# Patient Record
Sex: Male | Born: 1990 | Race: White | Hispanic: No | Marital: Single | Smoking: Never smoker
Health system: Southern US, Community
[De-identification: ages and names within clinical notes are randomized; demographics above are authoritative.]

---

## 2019-06-16 ENCOUNTER — Other Ambulatory Visit: Payer: Self-pay

## 2019-06-16 ENCOUNTER — Encounter (HOSPITAL_COMMUNITY): Payer: Self-pay | Admitting: Emergency Medicine

## 2019-06-16 ENCOUNTER — Emergency Department (HOSPITAL_COMMUNITY)
Admission: EM | Admit: 2019-06-16 | Discharge: 2019-06-16 | Disposition: A | Discharge status: Home / Self Care | Payer: BC Managed Care – PPO | Attending: Emergency Medicine | Admitting: Emergency Medicine

## 2019-06-16 DIAGNOSIS — Y999 Unspecified external cause status: Secondary | ICD-10-CM | POA: Insufficient documentation

## 2019-06-16 DIAGNOSIS — Y939 Activity, unspecified: Secondary | ICD-10-CM | POA: Insufficient documentation

## 2019-06-16 DIAGNOSIS — S0502XA Injury of conjunctiva and corneal abrasion without foreign body, left eye, initial encounter: Secondary | ICD-10-CM

## 2019-06-16 DIAGNOSIS — S0522XA Ocular laceration and rupture with prolapse or loss of intraocular tissue, left eye, initial encounter: Secondary | ICD-10-CM | POA: Diagnosis not present

## 2019-06-16 DIAGNOSIS — Y929 Unspecified place or not applicable: Secondary | ICD-10-CM | POA: Diagnosis not present

## 2019-06-16 DIAGNOSIS — X58XXXA Exposure to other specified factors, initial encounter: Secondary | ICD-10-CM | POA: Insufficient documentation

## 2019-06-16 DIAGNOSIS — S0592XA Unspecified injury of left eye and orbit, initial encounter: Secondary | ICD-10-CM | POA: Diagnosis present

## 2019-06-16 MED ORDER — FLUORESCEIN SODIUM 1 MG OP STRP
1.0000 | ORAL_STRIP | Freq: Once | OPHTHALMIC | Status: AC
  Administered 2019-06-16: 18:00:00 1 via OPHTHALMIC
  Filled 2019-06-16: qty 1

## 2019-06-16 MED ORDER — OXYCODONE-ACETAMINOPHEN 5-325 MG PO TABS
1.0000 | ORAL_TABLET | Freq: Once | ORAL | Status: AC
  Administered 2019-06-16: 18:00:00 1 via ORAL
  Filled 2019-06-16: qty 1

## 2019-06-16 MED ORDER — TETRACAINE HCL 0.5 % OP SOLN
2.0000 [drp] | Freq: Once | OPHTHALMIC | Status: AC
  Administered 2019-06-16: 2 [drp] via OPHTHALMIC
  Filled 2019-06-16: qty 4

## 2019-06-16 MED ORDER — OFLOXACIN 0.3 % OP SOLN
2.0000 [drp] | Freq: Four times a day (QID) | OPHTHALMIC | Status: DC
  Administered 2019-06-16: 2 [drp] via OPHTHALMIC
  Filled 2019-06-16: qty 5

## 2019-06-16 MED ORDER — ERYTHROMYCIN 5 MG/GM OP OINT
TOPICAL_OINTMENT | Freq: Once | OPHTHALMIC | Status: AC
  Filled 2019-06-16: qty 3.5

## 2019-06-16 MED ORDER — IBUPROFEN 200 MG PO TABS
600.0000 mg | ORAL_TABLET | Freq: Once | ORAL | Status: AC
  Administered 2019-06-16: 600 mg via ORAL
  Filled 2019-06-16: qty 3
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
## 2019-06-16 NOTE — ED Triage Notes (Signed)
Pt repots think must have scratched his left eye last night. Feels like something in it but flushed several times with no relief. Denies blurred vision.

## 2019-06-16 NOTE — Discharge Instructions (Addendum)
To use your eye medications please put 2 drops of the ofloxacin in your left eye 4 times a day.  AFTER the ofloxacin drops please put 1/2 inch ribbon in the left eye 4 times a day.   Go to Dr. Johnny Bridge office tomorrow to be seen at 830 am.  Please arrive at least 15 minutes early.   Today you received medications that may make you sleepy or impair your ability to make decisions.  For the next 24 hours please do not drive, operate heavy machinery, care for a small child with out another adult present, or perform any activities that may cause harm to you or someone else if you were to fall asleep or be impaired.   You are being prescribed a medication which may make you sleepy. Please follow up of listed precautions for at least 24 hours after taking one dose.  Please take Ibuprofen (Advil, motrin) and Tylenol (acetaminophen) to relieve your pain.  You may take up to 600 MG (3 pills) of normal strength ibuprofen every 8 hours as needed.  In between doses of ibuprofen you make take tylenol, up to 1,000 mg (two extra strength pills).  Do not take more than 3,000 mg tylenol in a 24 hour period.  Please check all medication labels as many medications such as pain and cold medications may contain tylenol.  Do not drink alcohol while taking these medications.  Do not take other NSAID'S while taking ibuprofen (such as aleve or naproxen).  Please take ibuprofen with food to decrease stomach upset.

## 2019-06-17 ENCOUNTER — Encounter (INDEPENDENT_AMBULATORY_CARE_PROVIDER_SITE_OTHER): Payer: Self-pay | Admitting: Ophthalmology

## 2019-06-17 ENCOUNTER — Ambulatory Visit (INDEPENDENT_AMBULATORY_CARE_PROVIDER_SITE_OTHER): Payer: BC Managed Care – PPO | Admitting: Ophthalmology

## 2019-06-17 DIAGNOSIS — H1830 Unspecified corneal membrane change: Secondary | ICD-10-CM | POA: Diagnosis not present

## 2019-06-17 DIAGNOSIS — H3581 Retinal edema: Secondary | ICD-10-CM | POA: Diagnosis not present
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
## 2019-06-21 ENCOUNTER — Encounter (INDEPENDENT_AMBULATORY_CARE_PROVIDER_SITE_OTHER): Payer: Self-pay | Admitting: Ophthalmology

## 2019-06-21 ENCOUNTER — Ambulatory Visit (INDEPENDENT_AMBULATORY_CARE_PROVIDER_SITE_OTHER): Payer: BC Managed Care – PPO | Admitting: Ophthalmology

## 2019-06-21 DIAGNOSIS — H3581 Retinal edema: Secondary | ICD-10-CM

## 2019-06-21 DIAGNOSIS — H1830 Unspecified corneal membrane change: Secondary | ICD-10-CM

## 2019-08-04 ENCOUNTER — Emergency Department (HOSPITAL_COMMUNITY): Payer: BC Managed Care – PPO

## 2019-08-04 ENCOUNTER — Emergency Department (HOSPITAL_COMMUNITY)
Admission: EM | Admit: 2019-08-04 | Discharge: 2019-08-04 | Disposition: A | Discharge status: Home / Self Care | Payer: BC Managed Care – PPO | Attending: Emergency Medicine | Admitting: Emergency Medicine

## 2019-08-04 ENCOUNTER — Other Ambulatory Visit: Payer: Self-pay

## 2019-08-04 ENCOUNTER — Encounter (HOSPITAL_COMMUNITY): Payer: Self-pay | Admitting: Emergency Medicine

## 2019-08-04 DIAGNOSIS — F1010 Alcohol abuse, uncomplicated: Secondary | ICD-10-CM

## 2019-08-04 DIAGNOSIS — Z20822 Contact with and (suspected) exposure to covid-19: Secondary | ICD-10-CM | POA: Insufficient documentation

## 2019-08-04 DIAGNOSIS — K209 Esophagitis, unspecified without bleeding: Secondary | ICD-10-CM | POA: Insufficient documentation

## 2019-08-04 DIAGNOSIS — R509 Fever, unspecified: Secondary | ICD-10-CM | POA: Insufficient documentation

## 2019-08-04 LAB — TROPONIN I (HIGH SENSITIVITY)
Troponin I (High Sensitivity): 3 ng/L (ref <18)
Troponin I (High Sensitivity): 4 ng/L (ref <18)

## 2019-08-04 LAB — HEPATIC FUNCTION PANEL
ALT: 35 U/L (ref 0–44)
AST: 28 U/L (ref 15–41)
Albumin: 4 g/dL (ref 3.5–5.0)
Alkaline Phosphatase: 47 U/L (ref 38–126)
Bilirubin, Direct: 0.1 mg/dL (ref 0.0–0.2)
Indirect Bilirubin: 0.3 mg/dL (ref 0.3–0.9)
Total Bilirubin: 0.4 mg/dL (ref 0.3–1.2)
Total Protein: 7.6 g/dL (ref 6.5–8.1)

## 2019-08-04 LAB — BASIC METABOLIC PANEL
Anion gap: 10 (ref 5–15)
BUN: 8 mg/dL (ref 6–20)
CO2: 25 mmol/L (ref 22–32)
Calcium: 8.7 mg/dL — ABNORMAL LOW (ref 8.9–10.3)
Chloride: 100 mmol/L (ref 98–111)
Creatinine, Ser: 0.76 mg/dL (ref 0.61–1.24)
GFR calc Af Amer: >60 mL/min (ref >60)
GFR calc non Af Amer: >60 mL/min (ref >60)
Glucose, Bld: 92 mg/dL (ref 70–99)
Potassium: 3.9 mmol/L (ref 3.5–5.1)
Sodium: 135 mmol/L (ref 135–145)

## 2019-08-04 LAB — CBC
HCT: 42.8 % (ref 39.0–52.0)
Hemoglobin: 14.5 g/dL (ref 13.0–17.0)
MCH: 31.3 pg (ref 26.0–34.0)
MCHC: 33.9 g/dL (ref 30.0–36.0)
MCV: 92.2 fL (ref 80.0–100.0)
Platelets: 154 10*3/uL (ref 150–400)
RBC: 4.64 MIL/uL (ref 4.22–5.81)
RDW: 12.1 % (ref 11.5–15.5)
WBC: 7.8 10*3/uL (ref 4.0–10.5)
nRBC: 0 % (ref 0.0–0.2)

## 2019-08-04 LAB — URINALYSIS, ROUTINE W REFLEX MICROSCOPIC
Bilirubin Urine: NEGATIVE
Glucose, UA: NEGATIVE mg/dL
Hgb urine dipstick: NEGATIVE
Ketones, ur: NEGATIVE mg/dL
Leukocytes,Ua: NEGATIVE
Nitrite: NEGATIVE
Protein, ur: NEGATIVE mg/dL
Specific Gravity, Urine: 1.019 (ref 1.005–1.030)
pH: 6 (ref 5.0–8.0)

## 2019-08-04 LAB — LIPASE, BLOOD: Lipase: 146 U/L — ABNORMAL HIGH (ref 11–51)

## 2019-08-04 LAB — SARS CORONAVIRUS 2 BY RT PCR (HOSPITAL ORDER, PERFORMED IN ~~LOC~~ HOSPITAL LAB): SARS Coronavirus 2: NEGATIVE

## 2019-08-04 LAB — D-DIMER, QUANTITATIVE: D-Dimer, Quant: 0.34 ug/mL-FEU (ref 0.00–0.50)

## 2019-08-04 MED ORDER — ALUM & MAG HYDROXIDE-SIMETH 200-200-20 MG/5ML PO SUSP
30.0000 mL | Freq: Once | ORAL | Status: AC
  Administered 2019-08-04: 30 mL via ORAL
  Filled 2019-08-04: qty 30

## 2019-08-04 MED ORDER — PANTOPRAZOLE SODIUM 40 MG PO TBEC
40.0000 mg | DELAYED_RELEASE_TABLET | Freq: Every day | ORAL | 0 refills | Status: DC
Start: 2019-08-04 — End: 2019-08-10

## 2019-08-04 MED ORDER — SODIUM CHLORIDE 0.9 % IV BOLUS
1000.0000 mL | Freq: Once | INTRAVENOUS | Status: AC
  Administered 2019-08-04: 1000 mL via INTRAVENOUS

## 2019-08-04 MED ORDER — FAMOTIDINE IN NACL 20-0.9 MG/50ML-% IV SOLN
20.0000 mg | Freq: Once | INTRAVENOUS | Status: AC
  Administered 2019-08-04: 20 mg via INTRAVENOUS
  Filled 2019-08-04: qty 50

## 2019-08-04 MED ORDER — IOHEXOL 300 MG/ML  SOLN
100.0000 mL | Freq: Once | INTRAMUSCULAR | Status: AC | PRN
  Administered 2019-08-04: 100 mL via INTRAVENOUS

## 2019-08-04 MED ORDER — SUCRALFATE 1 GM/10ML PO SUSP
1.0000 g | Freq: Three times a day (TID) | ORAL | Status: DC
  Administered 2019-08-04: 1 g via ORAL
  Filled 2019-08-04: qty 10

## 2019-08-04 MED ORDER — CHLORDIAZEPOXIDE HCL 25 MG PO CAPS
ORAL_CAPSULE | ORAL | 0 refills | Status: DC
Start: 2019-08-04 — End: 2019-09-07

## 2019-08-04 MED ORDER — PANTOPRAZOLE SODIUM 40 MG IV SOLR
40.0000 mg | Freq: Once | INTRAVENOUS | Status: AC
  Administered 2019-08-04: 40 mg via INTRAVENOUS
  Filled 2019-08-04: qty 40

## 2019-08-04 MED ORDER — ONDANSETRON HCL 4 MG/2ML IJ SOLN
4.0000 mg | Freq: Once | INTRAMUSCULAR | Status: AC
  Administered 2019-08-04: 4 mg via INTRAVENOUS
  Filled 2019-08-04: qty 2

## 2019-08-04 MED ORDER — ACETAMINOPHEN 500 MG PO TABS
1000.0000 mg | ORAL_TABLET | Freq: Once | ORAL | Status: AC
  Administered 2019-08-04: 1000 mg via ORAL
  Filled 2019-08-04: qty 2

## 2019-08-04 NOTE — ED Triage Notes (Signed)
Patient reports indigestion x3 days with burning in chest today.

## 2019-08-04 NOTE — ED Provider Notes (Signed)
[facility]-EMERGENCY DEPT Provider Note   CSN: [id number] Arrival date & time: 08/04/19  1421     History Chief Complaint  Patient presents with  . Chest Pain    Ryan Patton is a 29 y.o. male.  HPI   Pt is a 29 y/o male who presents to the ED today for eval of chest pain/epigastric abd pain that started about 3 days ago. States pain is intermittent and worsened this morning. Pain radiates to the left side of the chest. Reports pleuritic pain.   States that he recently cut down on his drinking and was drinking 1/5 of liquor daily but last week he cut down and now has only been drinking 3-4 times/week. Last drink was at 1pm. States he has had a few beers today.   He also reports chills, sweats,    Reports nausea and some vomiting (5 days ago which resolved). Had one episode of blood tinged sputum 5 days ago after multiple episodes of vomiting.  Denies cough, rhinorrhea, congestion,  He had some shortness of breath this morning which has improved.  Had COVID in Dec 2020.   marijuana 3-4 times/week, 1/5 liquor, last drink 1pm a few beers  History reviewed. No pertinent past medical history.  There are no problems to display for this patient.   History reviewed. No pertinent surgical history.     No family history on file.  Social History   Tobacco Use  . Smoking status: Not on file  Substance Use Topics  . Alcohol use: Not on file  . Drug use: Not on file    Home Medications Prior to Admission medications   Medication Sig Start Date End Date Taking? Authorizing Provider  chlordiazePOXIDE (LIBRIUM) 25 MG capsule 50mg  PO TID x 1D, then 25-50mg  PO BID X 1D, then 25-50mg  PO QD X 1D 08/04/19   Edric Fetterman S, PA-C  pantoprazole (PROTONIX) 40 MG tablet Take 1 tablet (40 mg total) by mouth daily for 14 days. 08/04/19 08/18/19  Libertie Hausler S, PA-C    Allergies    Patient has no known allergies.  Review of Systems   Review of Systems   Constitutional: Negative for chills and fever.  HENT: Negative for ear pain and sore throat.   Eyes: Negative for visual disturbance.  Respiratory: Negative for cough and shortness of breath.   Cardiovascular: Positive for chest pain. Negative for palpitations and leg swelling.  Gastrointestinal: Negative for abdominal pain, constipation, diarrhea, nausea and vomiting.  Genitourinary: Negative for dysuria and hematuria.  Musculoskeletal: Negative for back pain.  Skin: Negative for color change and rash.  Neurological: Positive for headaches.  All other systems reviewed and are negative.   Physical Exam Updated Vital Signs BP (!) 149/96   Pulse (!) 104   Temp (!) 100.5 F (38.1 C)   Resp 14   SpO2 98%   Physical Exam  ED Results / Procedures / Treatments   Labs (all labs ordered are listed, but only abnormal results are displayed) Labs Reviewed  BASIC METABOLIC PANEL - Abnormal; Notable for the following components:      Result Value   Calcium 8.7 (*)    All other components within normal limits  LIPASE, BLOOD - Abnormal; Notable for the following components:   Lipase 146 (*)    All other components within normal limits  SARS CORONAVIRUS 2 BY RT PCR (HOSPITAL ORDER, PERFORMED IN [facility])  CBC  D-DIMER, QUANTITATIVE (NOT AT [facility])  HEPATIC FUNCTION PANEL  URINALYSIS, ROUTINE W REFLEX MICROSCOPIC  TROPONIN I (HIGH SENSITIVITY)  TROPONIN I (HIGH SENSITIVITY)    EKG None  Radiology DG Chest 2 View  Result Date: 08/04/2019 CLINICAL DATA:  Chest pain, indigestion, remote COVID infection 5 months ago EXAM: CHEST - 2 VIEW COMPARISON:  None. FINDINGS: The heart size and mediastinal contours are within normal limits. Both lungs are clear. The visualized skeletal structures are unremarkable. IMPRESSION: No active cardiopulmonary disease. Electronically Signed   By: Judie Petit.  Shick M.D.   On: 08/04/2019 15:17   CT ABDOMEN PELVIS W CONTRAST  Result Date:  08/04/2019 CLINICAL DATA:  Indigestion.  Epigastric pain.  Fever.  Tachycardia. EXAM: CT ABDOMEN AND PELVIS WITH CONTRAST TECHNIQUE: Multidetector CT imaging of the abdomen and pelvis was performed using the standard protocol following bolus administration of intravenous contrast. CONTRAST:  OMNIPAQUE IOHEXOL 300 MG/ML  SOLN COMPARISON:  None. FINDINGS: Lower chest: Lung bases are clear. Hepatobiliary: No focal hepatic lesion. No biliary duct dilatation. Gallbladder is normal. Common bile duct is normal. Pancreas: Pancreas is normal. No ductal dilatation. No pancreatic inflammation. Spleen: Normal spleen Adrenals/urinary tract: Adrenal glands and kidneys are normal. The ureters and bladder normal. Stomach/Bowel: Mild circumferential thickening through the distal esophagus (image 7/series 2). Stomach, small-bowel and cecum are normal. The appendix is not identified but there is no pericecal inflammation to suggest appendicitis. The colon and rectosigmoid colon are normal. Vascular/Lymphatic: Abdominal aorta is normal caliber. No periportal or retroperitoneal adenopathy. No pelvic adenopathy. Reproductive: Prostate unremarkable Other: No free fluid. Musculoskeletal: No aggressive osseous lesion. IMPRESSION: 1. Mild thickening through the distal esophagus could represent esophagitis. 2. No acute findings in the abdomen pelvis otherwise. Electronically Signed   By: Genevive Bi M.D.   On: 08/04/2019 19:14    Procedures Procedures (including critical care time)  Medications Ordered in ED Medications  sucralfate (CARAFATE) 1 GM/10ML suspension 1 g (1 g Oral Given 08/04/19 2051)  acetaminophen (TYLENOL) tablet 1,000 mg (1,000 mg Oral Given 08/04/19 1812)  sodium chloride 0.9 % bolus 1,000 mL (0 mLs Intravenous Stopped 08/04/19 2006)  ondansetron (ZOFRAN) injection 4 mg (4 mg Intravenous Given 08/04/19 1812)  famotidine (PEPCID) IVPB 20 mg premix (0 mg Intravenous Stopped 08/04/19 2006)  alum & mag  hydroxide-simeth (MAALOX/MYLANTA) 200-200-20 MG/5ML suspension 30 mL (30 mLs Oral Given 08/04/19 1813)  iohexol (OMNIPAQUE) 300 MG/ML solution 100 mL (100 mLs Intravenous Contrast Given 08/04/19 1838)  sodium chloride 0.9 % bolus 1,000 mL (1,000 mLs Intravenous New Bag/Given (Non-Interop) 08/04/19 2026)  pantoprazole (PROTONIX) injection 40 mg (40 mg Intravenous Given 08/04/19 2051)    ED Course  I have reviewed the triage vital signs and the nursing notes.  Pertinent labs & imaging results that were available during my care of the patient were reviewed by me and considered in my medical decision making (see chart for details).    MDM Rules/Calculators/A&P                      29 year old male presenting for evaluation of chest pain/epigastric abdominal pain which feels consistent with heartburn.  Also with some nausea.  Patient has long history of EtOH use.  On arrival patient found to be febrile and tachycardic, his vital signs are otherwise reassuring and he is nontoxic and nonseptic appearing.  He has some upper abdominal tenderness on exam but he does not have any peritoneal signs.  Lungs clear to auscultation bilaterally.  Heart with tachycardia but otherwise reassuring.  CBC does not show any leukocytosis CMP with normal electrolytes, normal kidney and liver function Lipase is marginally elevated Troponins negative D-dimer negative Covid negative  EKG with sinus tach, borderline right axis deviation, otherwise reassuring   CXR  No active cardiopulmonary disease.  CT abd/pelvis  1. Mild thickening through the distal esophagus could represent esophagitis. 2. No acute findings in the abdomen pelvis otherwise.   Reassessed patient.  He states he is feeling somewhat better.  His heart rate is improved somewhat after IV fluids and his temperature is also improved.  We discussed findings of esophagitis on CT scan and also elevated lipase concerning for possible mild pancreatitis.  Discussed  cessation of alcohol use and he would like to stop.  We will give Librium taper and consult peer support.  We will also give him information for PCP and start him on a PPI for his esophagitis.    At shift change, patient still pending UA.  If negative, follow-up on above plan to DC with PPI and Librium taper. Care transitioned to [facility], PA-C at shift change.   Discussed case with Dr. Roslynn Amble who personally evaluated the patient and is in agreement with the plan.   Final Clinical Impression(s) / ED Diagnoses Final diagnoses:  Esophagitis  Alcohol abuse  Fever, unspecified fever cause    Rx / DC Orders ED Discharge Orders         Ordered    pantoprazole (PROTONIX) 40 MG tablet  Daily     08/04/19 2103    chlordiazePOXIDE (LIBRIUM) 25 MG capsule     08/04/19 2103    Consult to Peer Support    Provider:  (Not yet assigned)   08/04/19 2104           Rodney Booze, PA-C 08/04/19 2104    Lucrezia Starch, MD 08/05/19 1212

## 2019-08-04 NOTE — Discharge Instructions (Addendum)
Take Protonix as directed for your esophagitis.   Prescription given for Librium. Take medication as directed and do not operate machinery, drive a car, or work while taking this medication as it can make you drowsy.   Please follow up with your primary care provider within 5-7 days for re-evaluation of your symptoms. If you do not have a primary care provider, information for a healthcare clinic has been provided for you to make arrangements for follow up care. Please return to the emergency department for any new or worsening symptoms.

## 2019-08-09 ENCOUNTER — Emergency Department (HOSPITAL_COMMUNITY)
Admission: EM | Admit: 2019-08-09 | Discharge: 2019-08-09 | Discharge status: Against Medical Advice | Payer: BC Managed Care – PPO | Attending: Emergency Medicine | Admitting: Emergency Medicine

## 2019-08-09 ENCOUNTER — Encounter (HOSPITAL_COMMUNITY): Payer: Self-pay

## 2019-08-09 ENCOUNTER — Other Ambulatory Visit: Payer: Self-pay

## 2019-08-09 DIAGNOSIS — R1010 Upper abdominal pain, unspecified: Secondary | ICD-10-CM | POA: Insufficient documentation

## 2019-08-09 LAB — LIPASE, BLOOD: Lipase: 26 U/L (ref 11–51)

## 2019-08-09 LAB — COMPREHENSIVE METABOLIC PANEL
ALT: 26 U/L (ref 0–44)
AST: 18 U/L (ref 15–41)
Albumin: 3.9 g/dL (ref 3.5–5.0)
Alkaline Phosphatase: 45 U/L (ref 38–126)
Anion gap: 12 (ref 5–15)
BUN: 8 mg/dL (ref 6–20)
CO2: 27 mmol/L (ref 22–32)
Calcium: 9.1 mg/dL (ref 8.9–10.3)
Chloride: 101 mmol/L (ref 98–111)
Creatinine, Ser: 0.91 mg/dL (ref 0.61–1.24)
GFR calc Af Amer: >60 mL/min (ref >60)
GFR calc non Af Amer: >60 mL/min (ref >60)
Glucose, Bld: 98 mg/dL (ref 70–99)
Potassium: 4.3 mmol/L (ref 3.5–5.1)
Sodium: 140 mmol/L (ref 135–145)
Total Bilirubin: 0.9 mg/dL (ref 0.3–1.2)
Total Protein: 7.5 g/dL (ref 6.5–8.1)

## 2019-08-09 LAB — CBC
HCT: 42.1 % (ref 39.0–52.0)
Hemoglobin: 14.2 g/dL (ref 13.0–17.0)
MCH: 30.6 pg (ref 26.0–34.0)
MCHC: 33.7 g/dL (ref 30.0–36.0)
MCV: 90.7 fL (ref 80.0–100.0)
Platelets: 243 10*3/uL (ref 150–400)
RBC: 4.64 MIL/uL (ref 4.22–5.81)
RDW: 11.7 % (ref 11.5–15.5)
WBC: 5.4 10*3/uL (ref 4.0–10.5)
nRBC: 0 % (ref 0.0–0.2)

## 2019-08-09 MED ORDER — SODIUM CHLORIDE 0.9% FLUSH: 3.0000 mL | Freq: Once | INTRAVENOUS | Status: DC

## 2019-08-09 NOTE — ED Notes (Signed)
Pt called from lobby x3! No answer

## 2019-08-09 NOTE — ED Notes (Signed)
Called pt from lobby No answer 

## 2019-08-09 NOTE — ED Triage Notes (Signed)
Patient c/o mid upper abdominal pain since 08/04/19. Patient states he was diagnosed with esophagitis. Patient states the meds he received are not working and he has not eaten in 3 days.

## 2019-08-10 ENCOUNTER — Emergency Department (HOSPITAL_COMMUNITY)
Admission: EM | Admit: 2019-08-10 | Discharge: 2019-08-10 | Discharge status: Against Medical Advice | Payer: BC Managed Care – PPO | Attending: Emergency Medicine | Admitting: Emergency Medicine

## 2019-08-10 ENCOUNTER — Other Ambulatory Visit: Payer: Self-pay

## 2019-08-10 ENCOUNTER — Emergency Department (HOSPITAL_COMMUNITY): Payer: BC Managed Care – PPO

## 2019-08-10 ENCOUNTER — Encounter (HOSPITAL_COMMUNITY): Payer: Self-pay | Admitting: *Deleted

## 2019-08-10 DIAGNOSIS — R1084 Generalized abdominal pain: Secondary | ICD-10-CM

## 2019-08-10 DIAGNOSIS — Z5321 Procedure and treatment not carried out due to patient leaving prior to being seen by health care provider: Secondary | ICD-10-CM | POA: Insufficient documentation

## 2019-08-10 DIAGNOSIS — I10 Essential (primary) hypertension: Secondary | ICD-10-CM | POA: Insufficient documentation

## 2019-08-10 LAB — COMPREHENSIVE METABOLIC PANEL
ALT: 24 U/L (ref 0–44)
AST: 17 U/L (ref 15–41)
Albumin: 4.2 g/dL (ref 3.5–5.0)
Alkaline Phosphatase: 48 U/L (ref 38–126)
Anion gap: 12 (ref 5–15)
BUN: 8 mg/dL (ref 6–20)
CO2: 25 mmol/L (ref 22–32)
Calcium: 9.1 mg/dL (ref 8.9–10.3)
Chloride: 103 mmol/L (ref 98–111)
Creatinine, Ser: 0.91 mg/dL (ref 0.61–1.24)
GFR calc Af Amer: >60 mL/min (ref >60)
GFR calc non Af Amer: >60 mL/min (ref >60)
Glucose, Bld: 103 mg/dL — ABNORMAL HIGH (ref 70–99)
Potassium: 3.9 mmol/L (ref 3.5–5.1)
Sodium: 140 mmol/L (ref 135–145)
Total Bilirubin: 0.9 mg/dL (ref 0.3–1.2)
Total Protein: 7.6 g/dL (ref 6.5–8.1)

## 2019-08-10 LAB — CBC WITH DIFFERENTIAL/PLATELET
Abs Immature Granulocytes: 0.02 10*3/uL (ref 0.00–0.07)
Basophils Absolute: 0 10*3/uL (ref 0.0–0.1)
Basophils Relative: 1 %
Eosinophils Absolute: 0 10*3/uL (ref 0.0–0.5)
Eosinophils Relative: 1 %
HCT: 41.8 % (ref 39.0–52.0)
Hemoglobin: 14.3 g/dL (ref 13.0–17.0)
Immature Granulocytes: 0 %
Lymphocytes Relative: 27 %
Lymphs Abs: 1.5 10*3/uL (ref 0.7–4.0)
MCH: 30.6 pg (ref 26.0–34.0)
MCHC: 34.2 g/dL (ref 30.0–36.0)
MCV: 89.3 fL (ref 80.0–100.0)
Monocytes Absolute: 0.8 10*3/uL (ref 0.1–1.0)
Monocytes Relative: 15 %
Neutro Abs: 3.1 10*3/uL (ref 1.7–7.7)
Neutrophils Relative %: 56 %
Platelets: 286 10*3/uL (ref 150–400)
RBC: 4.68 MIL/uL (ref 4.22–5.81)
RDW: 11.6 % (ref 11.5–15.5)
WBC: 5.5 10*3/uL (ref 4.0–10.5)
nRBC: 0 % (ref 0.0–0.2)

## 2019-08-10 LAB — LIPASE, BLOOD: Lipase: 30 U/L (ref 11–51)

## 2019-08-10 LAB — POC OCCULT BLOOD, ED: Fecal Occult Bld: NEGATIVE

## 2019-08-10 MED ORDER — SUCRALFATE 1 GM/10ML PO SUSP: 1.0000 g | Freq: Three times a day (TID) | ORAL | 0 refills | Status: AC

## 2019-08-10 MED ORDER — SUCRALFATE 1 G PO TABS
1.0000 g | ORAL_TABLET | Freq: Once | ORAL | Status: AC
  Administered 2019-08-10: 1 g via ORAL
  Filled 2019-08-10: qty 1

## 2019-08-10 MED ORDER — PANTOPRAZOLE SODIUM 40 MG PO TBEC
40.0000 mg | DELAYED_RELEASE_TABLET | Freq: Two times a day (BID) | ORAL | 0 refills | Status: AC
Start: 2019-08-10 — End: 2019-09-09

## 2019-08-10 MED ORDER — FAMOTIDINE 20 MG PO TABS: 20.0000 mg | ORAL_TABLET | Freq: Two times a day (BID) | ORAL | 0 refills | Status: AC

## 2019-08-10 MED ORDER — ALUM & MAG HYDROXIDE-SIMETH 200-200-20 MG/5ML PO SUSP
30.0000 mL | Freq: Once | ORAL | Status: AC
  Administered 2019-08-10: 30 mL via ORAL
  Filled 2019-08-10: qty 30

## 2019-08-10 MED ORDER — LIDOCAINE VISCOUS HCL 2 % MT SOLN
15.0000 mL | Freq: Once | OROMUCOSAL | Status: AC
  Administered 2019-08-10: 15 mL via ORAL
  Filled 2019-08-10: qty 15

## 2019-08-10 MED ORDER — FAMOTIDINE IN NACL 20-0.9 MG/50ML-% IV SOLN
20.0000 mg | Freq: Once | INTRAVENOUS | Status: AC
  Administered 2019-08-10: 20 mg via INTRAVENOUS
  Filled 2019-08-10: qty 50

## 2019-08-10 MED ORDER — SODIUM CHLORIDE 0.9 % IV BOLUS
1000.0000 mL | Freq: Once | INTRAVENOUS | Status: AC
  Administered 2019-08-10: 1000 mL via INTRAVENOUS

## 2019-08-10 NOTE — ED Notes (Signed)
I called patient name for a room and no one responded 

## 2019-08-10 NOTE — ED Provider Notes (Signed)
[facility]-EMERGENCY DEPT Provider Note   CSN: [id number] Arrival date & time: 08/10/19  0731     History Chief Complaint  Patient presents with  . Abdominal Pain    Ryan Patton is a 29 y.o. male past history of alcoholism who presents for evaluation of persistent and continued abdominal pain and midsternal chest pain.  He was seen here in the ED on 08/04/2019 for evaluation of symptoms.  At that time, his work-up was concerning for esophagitis.  Additionally, he had a slight increase in his lipase.  He does report that he was a daily drinker but states that he has stopped over the last few weeks.  He states that he was discharged home with Pepcid which he has been taking.  He comes into the emergency department today because he states that he has continued to have persistent symptoms.  He reports epigastric abdominal pain that is constant in nature.  He states that it is worsened when he tries to eat something.  Additionally, he reports that he has a lot of heartburn and reports a burning sensation to the midsternal chest area.  He states that this is not as constant as the abdominal pain but is there for majority of the time and is worsened whenever he tries to eat anything.  He states that over the last 4 to 5 days, he has not been able to tolerate much p.o. because of the pain when swallowing and the pain to his abdomen.  He has not had any vomiting.  He does report that he had one episode of black stool yesterday.  He does state that he has been taking Pepto-Bismol.  He has not drink any alcohol since the visit on the ninth.  He does report that he vapes daily.  He does not know how many times he vapes but states "a lot."  He has not had any fever, difficulty breathing, dysuria, hematuria.  The history is provided by the patient.       History reviewed. No pertinent past medical history.  There are no problems to display for this patient.   History reviewed. No  pertinent surgical history.     Family History  Problem Relation Age of Onset  . Alcoholism Father     Social History   Tobacco Use  . Smoking status: Never Smoker  . Smokeless tobacco: Never Used  Vaping Use  . Vaping Use: Every day  . Substances: Nicotine, Flavoring  Substance Use Topics  . Alcohol use: Not Currently  . Drug use: Yes    Types: Marijuana    Home Medications Prior to Admission medications   Medication Sig Start Date End Date Taking? Authorizing Provider  acetaminophen (TYLENOL) 500 MG tablet Take 500 mg by mouth daily as needed for mild pain or headache.   Yes [provider]  tetrahydrozoline 0.05 % ophthalmic solution Place 1 drop into both eyes daily as needed (eye irritation).   Yes [provider]  chlordiazePOXIDE (LIBRIUM) 25 MG capsule 50mg  PO TID x 1D, then 25-50mg  PO BID X 1D, then 25-50mg  PO QD X 1D Patient not taking: Reported on 08/10/2019 08/04/19   Couture, Cortni S, PA-C  famotidine (PEPCID) 20 MG tablet Take 1 tablet (20 mg total) by mouth 2 (two) times daily. 08/10/19   08/12/19, PA-C  pantoprazole (PROTONIX) 40 MG tablet Take 1 tablet (40 mg total) by mouth 2 (two) times daily. 08/10/19 09/09/19  09/11/19, PA-C  sucralfate (CARAFATE) 1 GM/10ML suspension Take 10 mLs (1 g total) by mouth 4 (four) times daily -  with meals and at bedtime. 08/10/19   Maxwell Caul, PA-C    Allergies    Patient has no known allergies.  Review of Systems   Review of Systems  Constitutional: Negative for fever.  Respiratory: Negative for cough and shortness of breath.   Cardiovascular: Negative for chest pain.  Gastrointestinal: Positive for abdominal pain. Negative for diarrhea, nausea and vomiting.  Genitourinary: Negative for dysuria and hematuria.  All other systems reviewed and are negative.   Physical Exam Updated Vital Signs BP 134/86   Pulse 77   Temp 98.6 F (37 C) (Oral)   Resp 12   SpO2 100%   Physical  Exam Vitals and nursing note reviewed.  Constitutional:      Appearance: Normal appearance. He is well-developed.  HENT:     Head: Normocephalic and atraumatic.  Eyes:     General: Lids are normal.     Conjunctiva/sclera: Conjunctivae normal.     Pupils: Pupils are equal, round, and reactive to light.  Cardiovascular:     Rate and Rhythm: Normal rate and regular rhythm.     Pulses: Normal pulses.     Heart sounds: Normal heart sounds. No murmur heard.  No friction rub. No gallop.   Pulmonary:     Effort: Pulmonary effort is normal.     Breath sounds: Normal breath sounds.     Comments: Lungs clear to auscultation bilaterally.  Symmetric chest rise.  No wheezing, rales, rhonchi. Chest:     Comments: No crepitus noted.  Abdominal:     Palpations: Abdomen is soft. Abdomen is not rigid.     Tenderness: There is abdominal tenderness in the epigastric area. There is no guarding.     Comments: Abdomen soft, nondistended.  Tenderness palpation in epigastric region.  No rigidity, guarding.  No CVA tenderness noted.  Musculoskeletal:        General: Normal range of motion.     Cervical back: Full passive range of motion without pain.  Skin:    General: Skin is warm and dry.     Capillary Refill: Capillary refill takes less than 2 seconds.  Neurological:     Mental Status: He is alert and oriented to person, place, and time.  Psychiatric:        Speech: Speech normal.     ED Results / Procedures / Treatments   Labs (all labs ordered are listed, but only abnormal results are displayed) Labs Reviewed  COMPREHENSIVE METABOLIC PANEL - Abnormal; Notable for the following components:      Result Value   Glucose, Bld 103 (*)    All other components within normal limits  CBC WITH DIFFERENTIAL/PLATELET  LIPASE, BLOOD  POC OCCULT BLOOD, ED    EKG None  Radiology DG Chest 2 View  Result Date: 08/10/2019 CLINICAL DATA:  Chest pain. EXAM: CHEST - 2 VIEW COMPARISON:  08/04/2019.  FINDINGS: Mediastinum and hilar structures normal. Heart size normal. Lungs are clear. No pleural effusion or pneumothorax. No acute bony abnormality identified. IMPRESSION: No acute cardiopulmonary disease. Electronically Signed   By: Maisie Fus  Register   On: 08/10/2019 12:04    Procedures Procedures (including critical care time)  Medications Ordered in ED Medications  sodium chloride 0.9 % bolus 1,000 mL (0 mLs Intravenous Stopped 08/10/19 1349)  famotidine (PEPCID) IVPB 20 mg premix (0 mg Intravenous Stopped 08/10/19 1351)  alum &  mag hydroxide-simeth (MAALOX/MYLANTA) 200-200-20 MG/5ML suspension 30 mL (30 mLs Oral Given 08/10/19 1158)    And  lidocaine (XYLOCAINE) 2 % viscous mouth solution 15 mL (15 mLs Oral Given 08/10/19 1158)  sucralfate (CARAFATE) tablet 1 g (1 g Oral Given 08/10/19 1448)    ED Course  I have reviewed the triage vital signs and the nursing notes.  Pertinent labs & imaging results that were available during my care of the patient were reviewed by me and considered in my medical decision making (see chart for details).    MDM Rules/Calculators/A&P                          29 year old male past medical history of alcoholism who presents for evaluation of epigastric abdominal pain and midsternal chest burning that has been ongoing and persistent.  He was seen here on 08/04/2019 for evaluation of same things.  He reports that he was diagnosed with esophagitis.  He was discharged home with Pepcid.  Comes in today because symptoms have continued.  On initially arrival, he is afebrile, nontoxic-appearing.  Vital signs are stable.  He has tenderness palpation in the epigastric region.  Lungs clear to auscultation.  Suspect this may be gastritis versus esophagitis versus PUD.  History/physical exam not concerning for appendicitis, diverticulitis.  Plan to recheck labs.  We will plan for chest x-ray to evaluate any subcutaneous emphysema, though I doubt any esophageal perforation.    Pain medications given.  CBC shows no leukocytosis.  Hemoglobin stable.  CMP shows normal BUN and creatinine.  Lipase is 30.  This is down from 146 6 days ago.  Fecal occult is negative.  At this time, his blood work is stable.  His lipase is actually improved from his previous one 6 days ago.  At this time, I suspect this is continued symptoms from esophagitis.  He may have some degree of gastritis/PUD noted as well.  He is hemodynamically stable.   I discussed results with patient.  He states that he feels like his pain is improved after GI cocktail.  He still reports he feels some burning sensation.  I discussed with him regarding further work-up and here in the emergency department.  At this time, given reassuring labs, reassuring vitals, do not feel that he needs repeat CT imaging.  I did discuss this with patient and we discussed risk first benefits of obtaining another CD versus declining CT.  After engaging in shared decision making.  Patient agreed that he did not wish to have further CT imaging today.  I feel that this is reasonable given his reassuring work-up.  We will plan to increase his Protonix as well as add Pepcid.  Additionally, plan is Carafate.  Patient was not given a GI referral last time he was here in the ED.  We will plan to give him an outpatient GI referral that he continue to follow-up. At this time, patient exhibits no emergent life-threatening condition that require further evaluation in ED or admission.  Patient had ample opportunity for questions and discussion. All patient's questions were answered with full understanding. Strict return precautions discussed. Patient expresses understanding and agreement to plan.   Portions of this note were generated with Scientist, clinical (histocompatibility and immunogenetics). Dictation errors may occur despite best attempts at proofreading.   Final Clinical Impression(s) / ED Diagnoses Final diagnoses:  Generalized abdominal pain  Hypertension, unspecified type      Rx / DC Orders ED Discharge  Orders         Ordered    sucralfate (CARAFATE) 1 GM/10ML suspension  3 times daily with meals & bedtime     Discontinue  Reprint     08/10/19 1502    famotidine (PEPCID) 20 MG tablet  2 times daily     Discontinue  Reprint     08/10/19 1502    pantoprazole (PROTONIX) 40 MG tablet  2 times daily     Discontinue  Reprint     08/10/19 1502           Volanda Napoleon, PA-C 08/10/19 2044    Virgel Manifold, MD 08/13/19 1202

## 2019-08-10 NOTE — Discharge Instructions (Signed)
As we discussed, you can try taking Pepcid for your symptoms. You will add this to your protonix.   As we discussed, you will take Carafate before you eat which will help with symptoms.  Additionally, as we discussed, cutting back on smoking/vaping may contribute.  Do not take any ibuprofen as this may worsen your symptoms.  Additionally, alcohol can sometimes worsen your symptoms.  As we discussed, I gave you outpatient GI referral.  Please follow-up.  Return the emergency department for any worsening pain, fever, vomiting, difficulty breathing.

## 2019-08-10 NOTE — ED Triage Notes (Signed)
Pt here for upper abdominal pain. He was recently diagnosed with esophagitis at ED, prescribed protonix but states it isn't helping. He has been taking tums and pepto bismal, which provides temporary relief. States he is unable to eat due to pain. No nausea/emesis/diarrhea.

## 2019-09-06 ENCOUNTER — Emergency Department (HOSPITAL_COMMUNITY)
Admission: EM | Admit: 2019-09-06 | Discharge: 2019-09-07 | Disposition: A | Discharge status: Home / Self Care | Payer: BC Managed Care – PPO | Attending: Emergency Medicine | Admitting: Emergency Medicine

## 2019-09-06 ENCOUNTER — Emergency Department (HOSPITAL_COMMUNITY): Payer: BC Managed Care – PPO

## 2019-09-06 DIAGNOSIS — Z20822 Contact with and (suspected) exposure to covid-19: Secondary | ICD-10-CM | POA: Insufficient documentation

## 2019-09-06 DIAGNOSIS — F1020 Alcohol dependence, uncomplicated: Secondary | ICD-10-CM | POA: Diagnosis present

## 2019-09-06 DIAGNOSIS — F10129 Alcohol abuse with intoxication, unspecified: Secondary | ICD-10-CM | POA: Insufficient documentation

## 2019-09-06 DIAGNOSIS — R45851 Suicidal ideations: Secondary | ICD-10-CM | POA: Insufficient documentation

## 2019-09-06 DIAGNOSIS — E8729 Other acidosis: Secondary | ICD-10-CM

## 2019-09-06 DIAGNOSIS — E872 Acidosis: Secondary | ICD-10-CM | POA: Insufficient documentation

## 2019-09-06 DIAGNOSIS — E870 Hyperosmolality and hypernatremia: Secondary | ICD-10-CM | POA: Insufficient documentation

## 2019-09-06 DIAGNOSIS — F1094 Alcohol use, unspecified with alcohol-induced mood disorder: Secondary | ICD-10-CM

## 2019-09-06 DIAGNOSIS — F10920 Alcohol use, unspecified with intoxication, uncomplicated: Secondary | ICD-10-CM

## 2019-09-06 LAB — COMPREHENSIVE METABOLIC PANEL
ALT: 32 U/L (ref 0–44)
AST: 40 U/L (ref 15–41)
Albumin: 5 g/dL (ref 3.5–5.0)
Alkaline Phosphatase: 55 U/L (ref 38–126)
Anion gap: 20 — ABNORMAL HIGH (ref 5–15)
BUN: 7 mg/dL (ref 6–20)
CO2: 25 mmol/L (ref 22–32)
Calcium: 9.4 mg/dL (ref 8.9–10.3)
Chloride: 104 mmol/L (ref 98–111)
Creatinine, Ser: 0.96 mg/dL (ref 0.61–1.24)
GFR calc Af Amer: >60 mL/min (ref >60)
GFR calc non Af Amer: >60 mL/min (ref >60)
Glucose, Bld: 97 mg/dL (ref 70–99)
Potassium: 4.2 mmol/L (ref 3.5–5.1)
Sodium: 149 mmol/L — ABNORMAL HIGH (ref 135–145)
Total Bilirubin: 1.5 mg/dL — ABNORMAL HIGH (ref 0.3–1.2)
Total Protein: 7.8 g/dL (ref 6.5–8.1)

## 2019-09-06 LAB — CBC
HCT: 44.8 % (ref 39.0–52.0)
Hemoglobin: 15.7 g/dL (ref 13.0–17.0)
MCH: 30.7 pg (ref 26.0–34.0)
MCHC: 35 g/dL (ref 30.0–36.0)
MCV: 87.7 fL (ref 80.0–100.0)
Platelets: 250 10*3/uL (ref 150–400)
RBC: 5.11 MIL/uL (ref 4.22–5.81)
RDW: 12 % (ref 11.5–15.5)
WBC: 9.5 10*3/uL (ref 4.0–10.5)
nRBC: 0 % (ref 0.0–0.2)

## 2019-09-06 LAB — ETHANOL: Alcohol, Ethyl (B): 415 mg/dL (ref <10)

## 2019-09-06 MED ORDER — STERILE WATER FOR INJECTION IJ SOLN
INTRAMUSCULAR | Status: AC
  Filled 2019-09-06: qty 10

## 2019-09-06 MED ORDER — ZIPRASIDONE MESYLATE 20 MG IM SOLR: 20.0000 mg | Freq: Once | INTRAMUSCULAR | Status: AC

## 2019-09-06 MED ORDER — LORAZEPAM 2 MG/ML IJ SOLN
1.0000 mg | Freq: Once | INTRAMUSCULAR | Status: AC
  Administered 2019-09-06: 1 mg via INTRAMUSCULAR
  Filled 2019-09-06: qty 1

## 2019-09-06 MED ORDER — ZIPRASIDONE MESYLATE 20 MG IM SOLR
INTRAMUSCULAR | Status: AC
  Administered 2019-09-06: 20 mg via INTRAMUSCULAR
  Filled 2019-09-06: qty 20

## 2019-09-06 NOTE — ED Notes (Signed)
Writer walked patients wife out after patient began to act aggressive and manic, patients wife asked to be contacted after patient gets situated after behavior incident, states she has to be too work at [employer] and would like to know what exactly is going to be the game plan for patients treatment.
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
## 2019-09-07 ENCOUNTER — Emergency Department (HOSPITAL_COMMUNITY)
Admission: EM | Admit: 2019-09-07 | Discharge: 2019-09-08 | Discharge status: Against Medical Advice | Payer: BC Managed Care – PPO | Source: Home / Self Care

## 2019-09-07 ENCOUNTER — Encounter (HOSPITAL_COMMUNITY): Payer: Self-pay | Admitting: Registered Nurse

## 2019-09-07 ENCOUNTER — Other Ambulatory Visit: Payer: Self-pay

## 2019-09-07 ENCOUNTER — Encounter (HOSPITAL_COMMUNITY): Payer: Self-pay | Admitting: Emergency Medicine

## 2019-09-07 DIAGNOSIS — F1020 Alcohol dependence, uncomplicated: Secondary | ICD-10-CM | POA: Diagnosis present

## 2019-09-07 DIAGNOSIS — F1094 Alcohol use, unspecified with alcohol-induced mood disorder: Secondary | ICD-10-CM

## 2019-09-07 LAB — RAPID URINE DRUG SCREEN, HOSP PERFORMED
Amphetamines: NOT DETECTED
Barbiturates: NOT DETECTED
Benzodiazepines: NOT DETECTED
Cocaine: NOT DETECTED
Opiates: NOT DETECTED
Tetrahydrocannabinol: POSITIVE — AB

## 2019-09-07 LAB — URINALYSIS, ROUTINE W REFLEX MICROSCOPIC
Bilirubin Urine: NEGATIVE
Glucose, UA: >=500 mg/dL — AB
Hgb urine dipstick: NEGATIVE
Ketones, ur: NEGATIVE mg/dL
Leukocytes,Ua: NEGATIVE
Nitrite: NEGATIVE
Protein, ur: 100 mg/dL — AB
Specific Gravity, Urine: 1.012 (ref 1.005–1.030)
pH: 5 (ref 5.0–8.0)

## 2019-09-07 LAB — BASIC METABOLIC PANEL
Anion gap: 10 (ref 5–15)
BUN: 6 mg/dL (ref 6–20)
CO2: 28 mmol/L (ref 22–32)
Calcium: 8.8 mg/dL — ABNORMAL LOW (ref 8.9–10.3)
Chloride: 105 mmol/L (ref 98–111)
Creatinine, Ser: 0.83 mg/dL (ref 0.61–1.24)
GFR calc Af Amer: >60 mL/min (ref >60)
GFR calc non Af Amer: >60 mL/min (ref >60)
Glucose, Bld: 97 mg/dL (ref 70–99)
Potassium: 4 mmol/L (ref 3.5–5.1)
Sodium: 143 mmol/L (ref 135–145)

## 2019-09-07 LAB — LACTIC ACID, PLASMA
Lactic Acid, Venous: 4 mmol/L (ref 0.5–1.9)
Lactic Acid, Venous: 1.2 mmol/L (ref 0.5–1.9)

## 2019-09-07 LAB — SARS CORONAVIRUS 2 BY RT PCR (HOSPITAL ORDER, PERFORMED IN ~~LOC~~ HOSPITAL LAB): SARS Coronavirus 2: NEGATIVE

## 2019-09-07 LAB — SODIUM, URINE, RANDOM: Sodium, Ur: 18 mmol/L

## 2019-09-07 MED ORDER — LORAZEPAM 2 MG/ML IJ SOLN: 0.0000 mg | Freq: Two times a day (BID) | INTRAMUSCULAR | Status: DC

## 2019-09-07 MED ORDER — LORAZEPAM 2 MG/ML IJ SOLN
0.0000 mg | Freq: Four times a day (QID) | INTRAMUSCULAR | Status: DC
  Administered 2019-09-07: 2 mg via INTRAVENOUS
  Filled 2019-09-07: qty 1

## 2019-09-07 MED ORDER — DEXTROSE 5 % AND 0.45 % NACL IV BOLUS
1000.0000 mL | Freq: Once | INTRAVENOUS | Status: AC
  Administered 2019-09-07: 1000 mL via INTRAVENOUS

## 2019-09-07 MED ORDER — LORAZEPAM 1 MG PO TABS: 0.0000 mg | ORAL_TABLET | Freq: Two times a day (BID) | ORAL | Status: DC

## 2019-09-07 MED ORDER — THIAMINE HCL 100 MG/ML IJ SOLN
100.0000 mg | Freq: Every day | INTRAMUSCULAR | Status: DC
  Administered 2019-09-07: 100 mg via INTRAVENOUS
  Filled 2019-09-07: qty 2

## 2019-09-07 MED ORDER — SODIUM CHLORIDE 0.45 % IV BOLUS: 1000.0000 mL | Freq: Once | INTRAVENOUS | Status: DC

## 2019-09-07 MED ORDER — CHLORDIAZEPOXIDE HCL 25 MG PO CAPS
ORAL_CAPSULE | ORAL | 0 refills | Status: AC
Start: 2019-09-07 — End: ?

## 2019-09-07 MED ORDER — THIAMINE HCL 100 MG PO TABS
100.0000 mg | ORAL_TABLET | Freq: Every day | ORAL | Status: DC
  Administered 2019-09-07: 100 mg via ORAL
  Filled 2019-09-07: qty 1

## 2019-09-07 MED ORDER — LORAZEPAM 1 MG PO TABS
0.0000 mg | ORAL_TABLET | Freq: Four times a day (QID) | ORAL | Status: DC
  Administered 2019-09-07: 1 mg via ORAL
  Filled 2019-09-07: qty 1

## 2019-09-07 NOTE — ED Notes (Signed)
Pt has been changed into wine colored scrubs, wanded by security, and his belongings placed in one white bag in the cabinet labeled, "patient belongings [address]."
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
## 2019-09-07 NOTE — BH Assessment (Signed)
Behavioral Health Note:  TTS attempted to see patient, but his nurse states that he is too sedated to see at this time.
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
## 2019-09-07 NOTE — BH Assessment (Addendum)
BHH Assessment Progress Note  Per Shuvon Rankin, FNP, this pt does not require psychiatric hospitalization at this time.  Pt presents under IVC initiated by EDP Linwood Dibbles, MD which has been rescinded by Nelly Rout, MD.  Pt is to be discharged from [facility].  Pt reportedly does not want referrals for outpatient treatment at this time, but information for area providers that offer psychiatry, therapy and substance abuse treatment have been included in his discharge instructions in case he reconsiders at a future date.  Pt's nurse has been notified.  Doylene Canning, MA Triage Specialist [phone]

## 2019-09-07 NOTE — ED Notes (Signed)
Assumed care of pt at this time, pt sleeping in stretcher, breathing even and unlabored. No s/sx of acute distress.

## 2019-09-07 NOTE — Discharge Instructions (Signed)
For your behavioral health needs, you are advised to follow up with one of the providers listed below.  Both offer psychiatry, therapy and substance abuse treatment.  Call them at your earliest opportunity to schedule an intake appointment:       [facility]. [facility]. [address]      [place], [place] [address]      [phone]       [facility]      [address] [place], [place] [address]      [phone]

## 2019-09-07 NOTE — ED Notes (Signed)
Pt discharged from this ED in stable condition at this time. All discharge instructions and follow up care reviewed with pt with no further questions at this time. Pt ambulatory with steady gait, clear speech.  

## 2019-09-07 NOTE — ED Triage Notes (Signed)
Pt from home reports he needs detox from alcohol.  Has drank 1/2 gallon of liquor today w/ the last drink being an hour ago.  Denies any SI/HI

## 2019-09-07 NOTE — ED Notes (Addendum)
Patient's belongings in patient bag behind nurse's station returned to patient. Patient's black phone found in patient bin after discharge. Attempted to call mother and girlfriend under numbers listed, both numbers not working at 1300. Attempted to call mother again, line still not working. Phone labeled and given to security. Registration notified of phone and to return to patient if he returns.

## 2019-09-07 NOTE — BHH Suicide Risk Assessment (Cosign Needed)
Suicide Risk Assessment  Discharge Assessment   [facility] Discharge Suicide Risk Assessment   Principal Problem: Alcohol-induced mood disorder (HCC) Discharge Diagnoses: Principal Problem:   Alcohol-induced mood disorder (HCC) Active Problems:   Alcohol use disorder, severe, dependence (HCC)   Total Time spent with patient: 30 minutes  Musculoskeletal: Strength & Muscle Tone: within normal limits Gait & Station: normal Patient leans: N/A  Psychiatric Specialty Exam: Review of Systems  Psychiatric/Behavioral: Positive for substance abuse. Negative for hallucinations, memory loss and suicidal ideas. Depression: Stable. The patient is not nervous/anxious and does not have insomnia.   All other systems reviewed and are negative.    Blood pressure (!) 149/95, pulse (!) 118, temperature 98.2 F (36.8 C), temperature source Oral, resp. rate 19, height 5\' 10"  (1.778 m), weight 79.4 kg, SpO2 97 %.Body mass index is 25.11 kg/m.  General Appearance: Casual  Eye Contact::  Good  Speech:  Blocked and Normal Rate409  Volume:  Normal  Mood:  "Good"  Affect:  Appropriate and Congruent  Thought Process:  Coherent, Goal Directed and Descriptions of Associations: Intact  Orientation:  Full (Time, Place, and Person)  Thought Content:  WDL  Suicidal Thoughts:  No  Homicidal Thoughts:  No  Memory:  Immediate;   Good Recent;   Good  Judgement:  Intact  Insight:  Present  Psychomotor Activity:  Normal  Concentration:  Good  Recall:  Good  Fund of Knowledge:Good  Language: Good  Akathisia:  No  Handed:  Right  AIMS (if indicated):     Assets:  Communication Skills Desire for Improvement Housing Social Support  Sleep:     Cognition: WNL  ADL's:  Intact   Mental Status Per Nursing Assessment::   On Admission:    Ryan Patton, 29 y.o., male patient seen face to face by this provider, consulted with Dr. 37; and chart reviewed on 09/07/19.  On evaluation Ryan Patton reports that   He was intoxicated last night when he made the statement of being suicidal.  States that he does not want to kill himself.  Patient denies prior history of suicide attempt; but does state that he has a problem with alcohol drinking a fifth of liquor daily.  States that he has been to multiple rehab facilities and AA and neither has helped.  States that he was given Librium once as an outpatient and he was alcohol free for one month.  States that he has a job interview today at 2 PM and doesn't want to miss it.  Patient informs that he lives with his girlfriend who is supportive.   During evaluation Ryan Patton is alert/oriented x 4; calm/cooperative; and mood is congruent with affect.  He does not appear to be responding to internal/external stimuli or delusional thoughts.  Patient denies suicidal/self-harm/homicidal ideation, psychosis, and paranoia.  Patient answered question appropriately.   Dr. Charlie Pitter will give prescription for Librium, and resources for outpatient psychiatric and substance use services will be given to the patient.   Demographic Factors:  Male, Caucasian and Unemployed  Loss Factors: None  Historical Factors: Impulsivity  Risk Reduction Factors:   Religious beliefs about death, Living with another person, especially a relative and Positive social support  Continued Clinical Symptoms:  Alcohol/Substance Abuse/Dependencies  Cognitive Features That Contribute To Risk:  None    Suicide Risk:  Minimal: No identifiable suicidal ideation.  Patients presenting with no risk factors but with morbid ruminations; may be classified as minimal risk based on the severity of the  depressive symptoms   Disposition:  Psychiatrically cleared No evidence of imminent risk to self or others at present.   Patient does not meet criteria for psychiatric inpatient admission. Supportive therapy provided about ongoing stressors. Discussed crisis plan, support from social network, calling  911, coming to the Emergency Department, and calling Suicide Hotline.  Plan Of Care/Follow-up recommendations:  Activity:  As tolerated Diet:  Heart healthy     Discharge Instructions     For your behavioral health needs, you are advised to follow up with one of the providers listed below.  Both offer psychiatry, therapy and substance abuse treatment.  Call them at your earliest opportunity to schedule an intake appointment:       [facility]. [facility]. Laurell Josephs [address], [place] [address]      [phone]       [facility]      [address] [place], [place] [address]      [phone]      Assunta Found, NP 09/07/2019, 11:47 AM

## 2019-09-08 LAB — COMPREHENSIVE METABOLIC PANEL
ALT: 55 U/L — ABNORMAL HIGH (ref 0–44)
AST: 72 U/L — ABNORMAL HIGH (ref 15–41)
Albumin: 4.6 g/dL (ref 3.5–5.0)
Alkaline Phosphatase: 51 U/L (ref 38–126)
Anion gap: 18 — ABNORMAL HIGH (ref 5–15)
BUN: 6 mg/dL (ref 6–20)
CO2: 23 mmol/L (ref 22–32)
Calcium: 9.1 mg/dL (ref 8.9–10.3)
Chloride: 107 mmol/L (ref 98–111)
Creatinine, Ser: 1.02 mg/dL (ref 0.61–1.24)
GFR calc Af Amer: >60 mL/min (ref >60)
GFR calc non Af Amer: >60 mL/min (ref >60)
Glucose, Bld: 101 mg/dL — ABNORMAL HIGH (ref 70–99)
Potassium: 3.5 mmol/L (ref 3.5–5.1)
Sodium: 148 mmol/L — ABNORMAL HIGH (ref 135–145)
Total Bilirubin: 1.6 mg/dL — ABNORMAL HIGH (ref 0.3–1.2)
Total Protein: 7 g/dL (ref 6.5–8.1)

## 2019-09-08 LAB — CBC
HCT: 41.5 % (ref 39.0–52.0)
Hemoglobin: 14.1 g/dL (ref 13.0–17.0)
MCH: 29.8 pg (ref 26.0–34.0)
MCHC: 34 g/dL (ref 30.0–36.0)
MCV: 87.7 fL (ref 80.0–100.0)
Platelets: 281 10*3/uL (ref 150–400)
RBC: 4.73 MIL/uL (ref 4.22–5.81)
RDW: 11.9 % (ref 11.5–15.5)
WBC: 8.9 10*3/uL (ref 4.0–10.5)
nRBC: 0 % (ref 0.0–0.2)

## 2019-09-08 LAB — RAPID URINE DRUG SCREEN, HOSP PERFORMED
Amphetamines: NOT DETECTED
Barbiturates: NOT DETECTED
Benzodiazepines: NOT DETECTED
Cocaine: NOT DETECTED
Opiates: NOT DETECTED
Tetrahydrocannabinol: NOT DETECTED

## 2019-09-08 LAB — ETHANOL: Alcohol, Ethyl (B): 385 mg/dL (ref <10)

## 2019-10-24 ENCOUNTER — Other Ambulatory Visit: Payer: Self-pay

## 2019-10-24 ENCOUNTER — Emergency Department
Admission: EM | Admit: 2019-10-24 | Discharge: 2019-10-24 | Disposition: A | Discharge status: Home / Self Care | Payer: BC Managed Care – PPO | Attending: Emergency Medicine | Admitting: Emergency Medicine

## 2019-10-24 DIAGNOSIS — F10120 Alcohol abuse with intoxication, uncomplicated: Secondary | ICD-10-CM | POA: Insufficient documentation

## 2019-10-24 DIAGNOSIS — Z5321 Procedure and treatment not carried out due to patient leaving prior to being seen by health care provider: Secondary | ICD-10-CM | POA: Diagnosis not present

## 2019-10-24 LAB — COMPREHENSIVE METABOLIC PANEL
ALT: 20 U/L (ref 0–44)
AST: 26 U/L (ref 15–41)
Albumin: 3.7 g/dL (ref 3.5–5.0)
Alkaline Phosphatase: 54 U/L (ref 38–126)
Anion gap: 12 (ref 5–15)
BUN: 6 mg/dL (ref 6–20)
CO2: 25 mmol/L (ref 22–32)
Calcium: 8.3 mg/dL — ABNORMAL LOW (ref 8.9–10.3)
Chloride: 112 mmol/L — ABNORMAL HIGH (ref 98–111)
Creatinine, Ser: 0.74 mg/dL (ref 0.61–1.24)
GFR calc Af Amer: >60 mL/min (ref >60)
GFR calc non Af Amer: >60 mL/min (ref >60)
Glucose, Bld: 97 mg/dL (ref 70–99)
Potassium: 3.7 mmol/L (ref 3.5–5.1)
Sodium: 149 mmol/L — ABNORMAL HIGH (ref 135–145)
Total Bilirubin: 0.7 mg/dL (ref 0.3–1.2)
Total Protein: 7.2 g/dL (ref 6.5–8.1)

## 2019-10-24 LAB — URINE DRUG SCREEN, QUALITATIVE (ARMC ONLY)
Amphetamines, Ur Screen: NOT DETECTED
Barbiturates, Ur Screen: NOT DETECTED
Benzodiazepine, Ur Scrn: NOT DETECTED
Cannabinoid 50 Ng, Ur ~~LOC~~: NOT DETECTED
Cocaine Metabolite,Ur ~~LOC~~: NOT DETECTED
MDMA (Ecstasy)Ur Screen: NOT DETECTED
Methadone Scn, Ur: NOT DETECTED
Opiate, Ur Screen: NOT DETECTED
Phencyclidine (PCP) Ur S: NOT DETECTED
Tricyclic, Ur Screen: NOT DETECTED

## 2019-10-24 LAB — CBC
HCT: 38.7 % — ABNORMAL LOW (ref 39.0–52.0)
Hemoglobin: 13 g/dL (ref 13.0–17.0)
MCH: 30.7 pg (ref 26.0–34.0)
MCHC: 33.6 g/dL (ref 30.0–36.0)
MCV: 91.5 fL (ref 80.0–100.0)
Platelets: 374 10*3/uL (ref 150–400)
RBC: 4.23 MIL/uL (ref 4.22–5.81)
RDW: 13.2 % (ref 11.5–15.5)
WBC: 4.9 10*3/uL (ref 4.0–10.5)
nRBC: 0 % (ref 0.0–0.2)

## 2019-10-24 LAB — ETHANOL: Alcohol, Ethyl (B): 369 mg/dL (ref <10)
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
## 2019-10-24 NOTE — ED Triage Notes (Signed)
Pt ambulatory in lobby without difficulty no slurred speech noted, pt states he is going to get an [place] home and does not want to stay to be seen.

## 2020-10-21 IMAGING — CR DG CHEST 2V
2 series · 2 of 2 positions shown · non-contrast
Comparison: 08/04/2019.

CLINICAL DATA: Chest pain.

EXAM:
CHEST - 2 VIEW

[w chest pa]
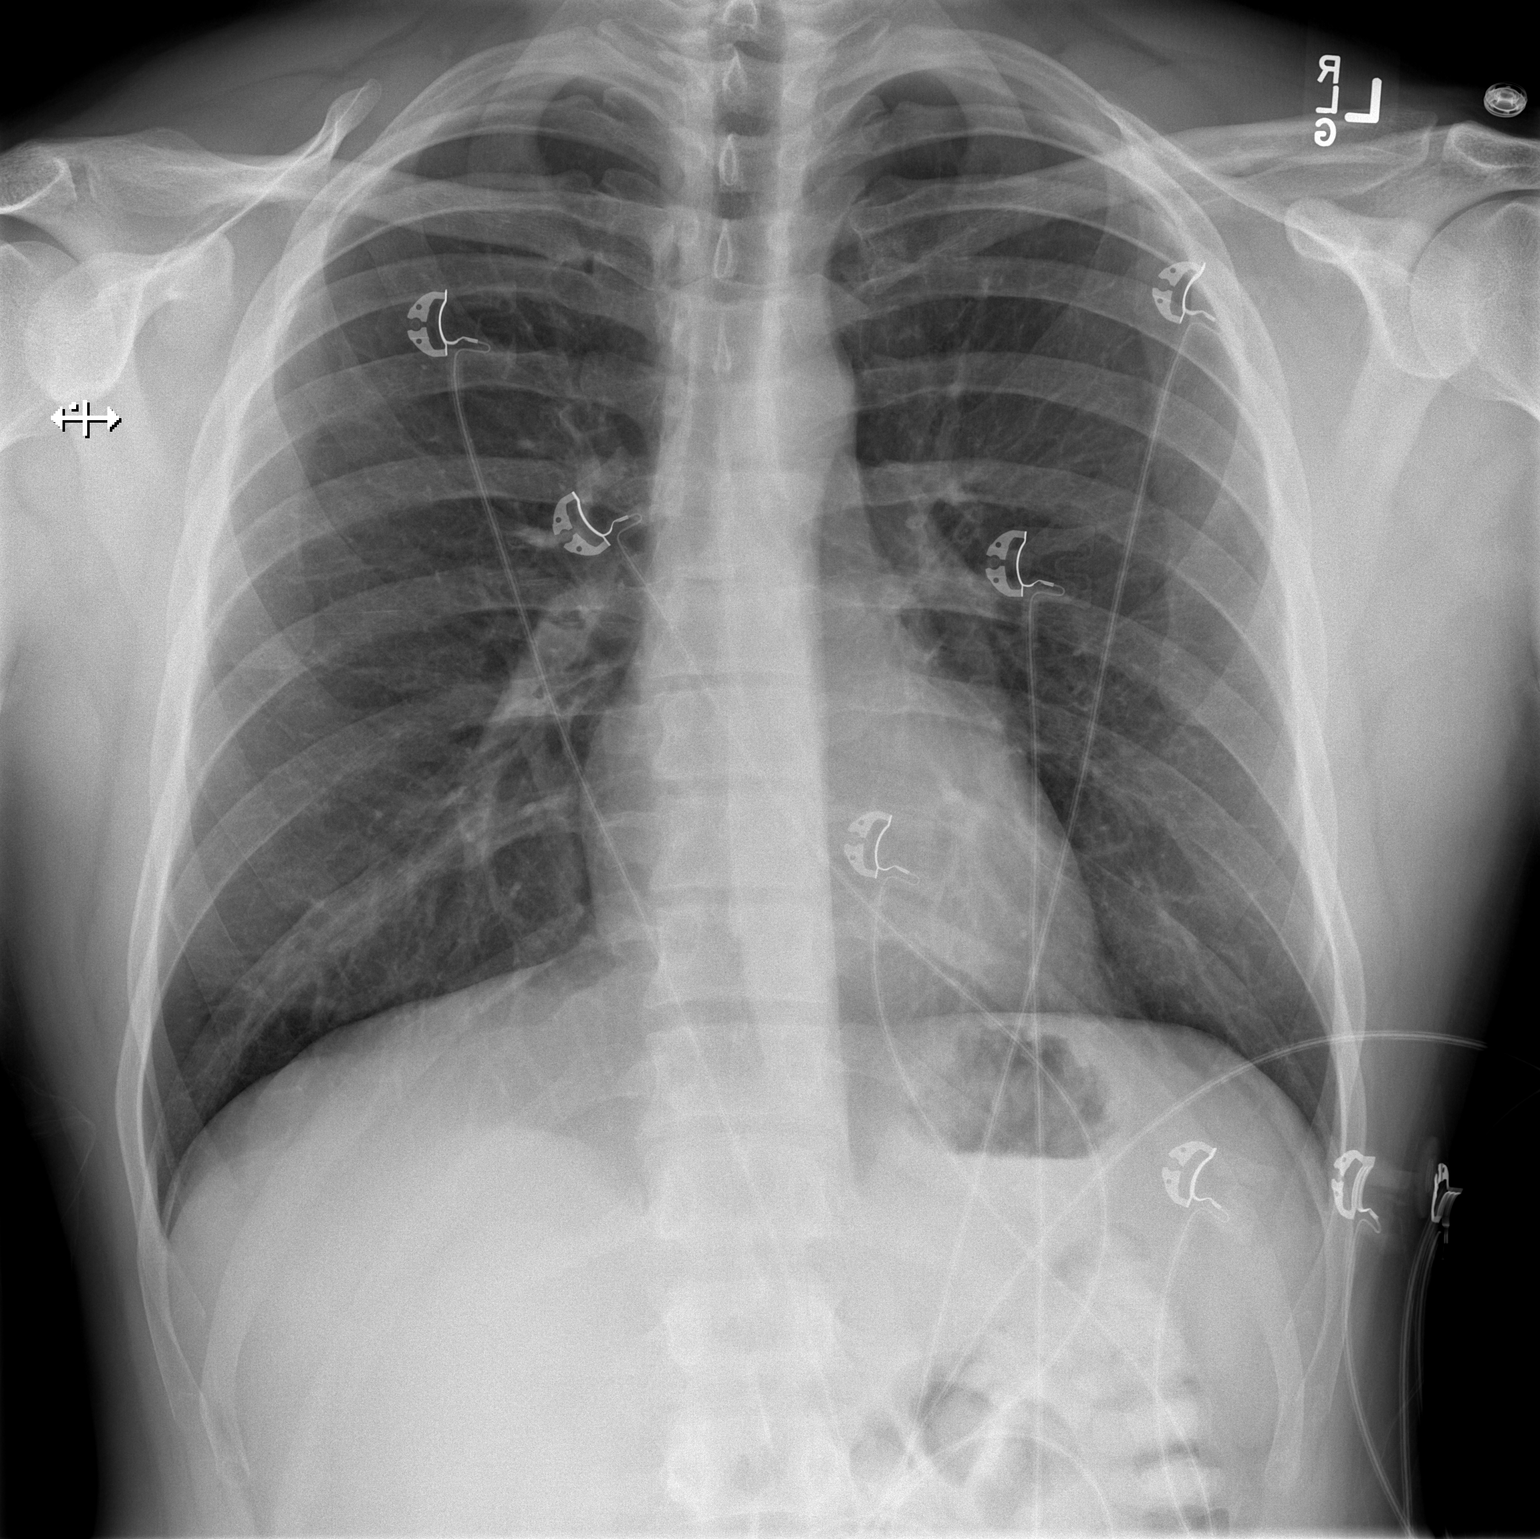

[w chest lat]
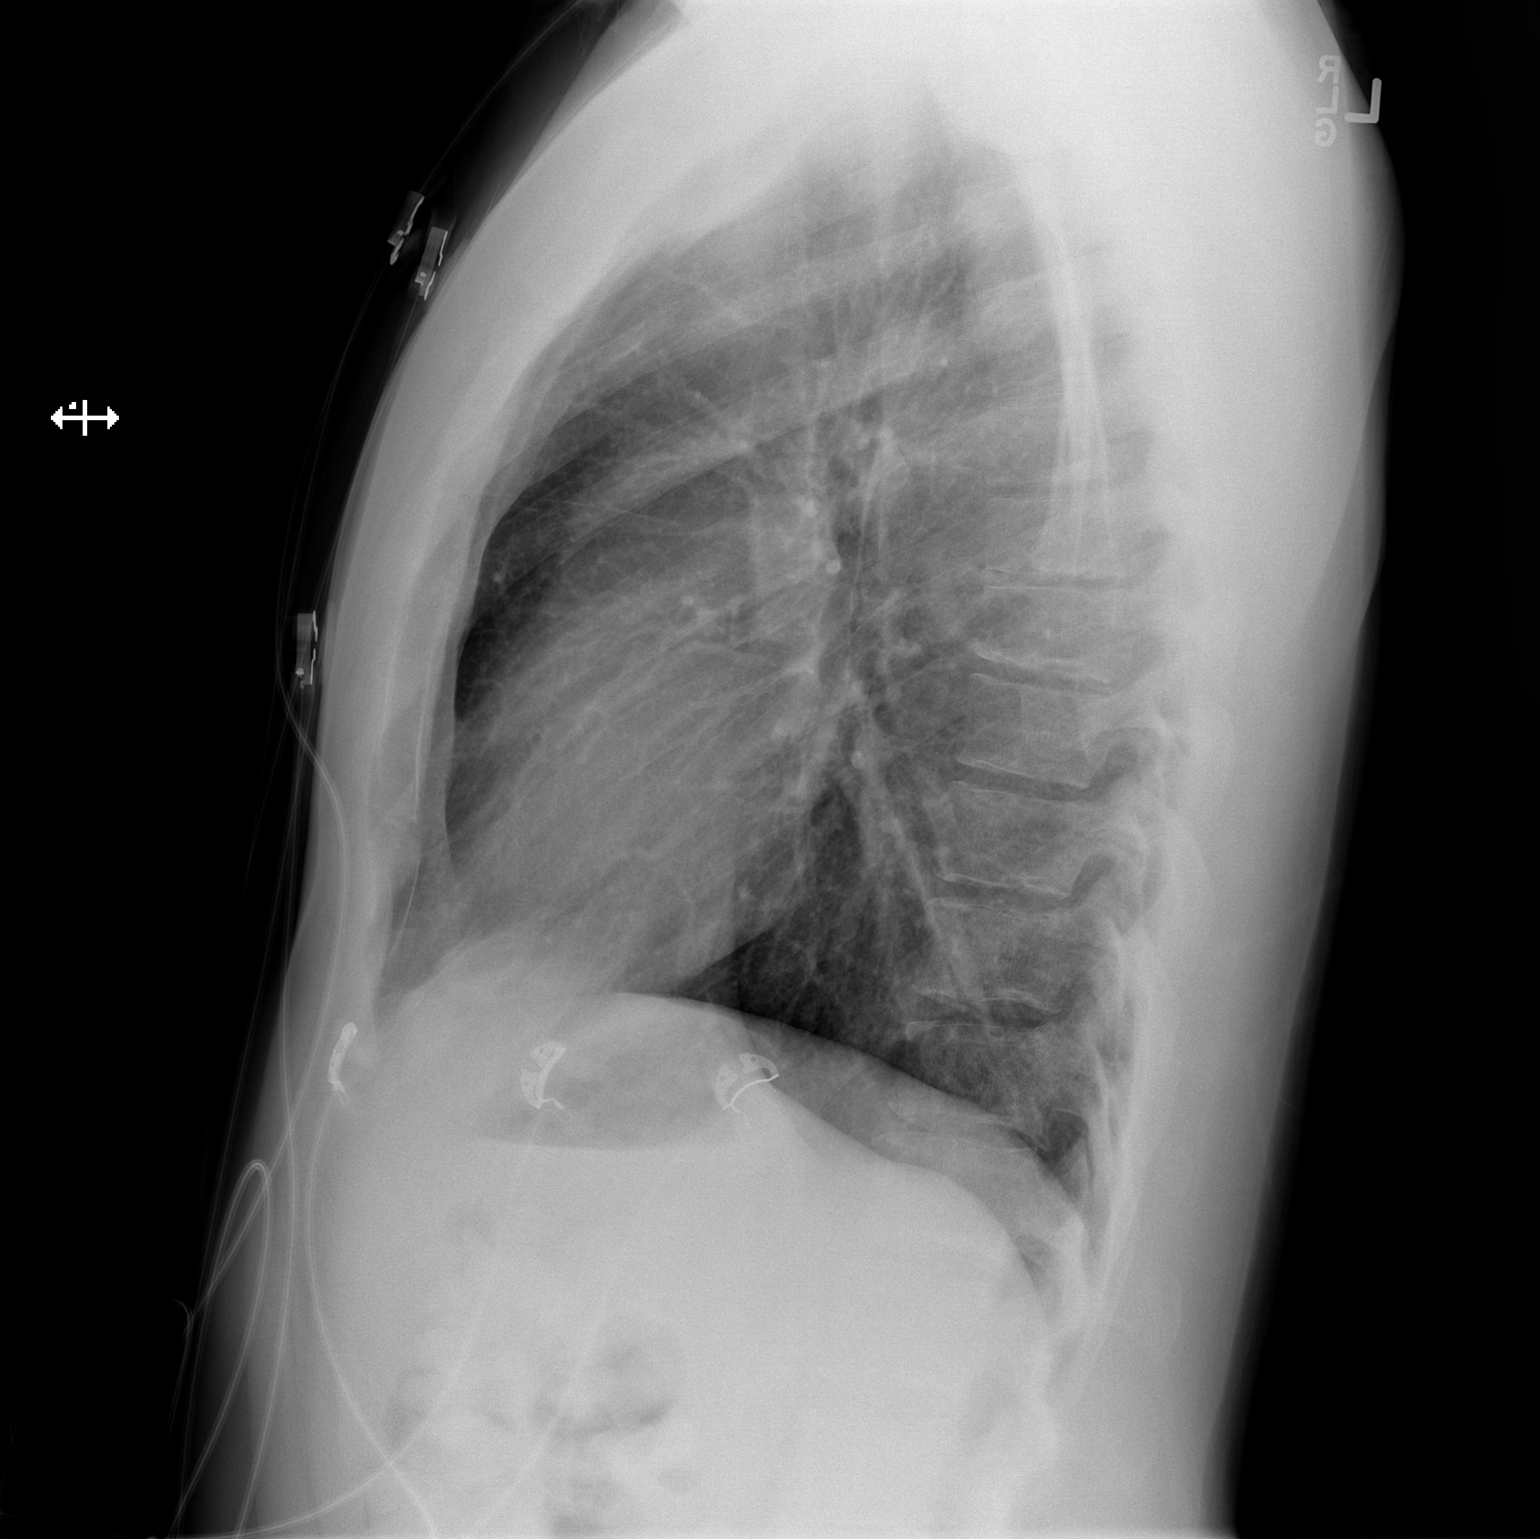

[2 of 2 positions shown; findings below may reference images not displayed]

FINDINGS: Mediastinum and hilar structures normal. Heart size normal. Lungs
are clear. No pleural effusion or pneumothorax. No acute bony
abnormality identified.
IMPRESSION: No acute cardiopulmonary disease.
